# Patient Record
Sex: Male | Born: 1995 | Race: Black or African American | Hispanic: No | Marital: Single | Smoking: Never smoker
Health system: Southern US, Community
[De-identification: ages and names within clinical notes are randomized; demographics above are authoritative.]

## PROBLEM LIST (undated history)

## (undated) DIAGNOSIS — J302 Other seasonal allergic rhinitis: Secondary | ICD-10-CM

---
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
## 2017-03-27 IMAGING — DX DG KNEE COMPLETE 4+V*L*
4 series · 4 of 4 positions shown · non-contrast
Comparison: None.

CLINICAL DATA: Medial pain. Knot in the left knee on [REDACTED].
Football game on [REDACTED] night. Remembers in injury. No history of
injury or surgeries previously.

EXAM:
LEFT KNEE - COMPLETE 4+ VIEW

[knee ap]
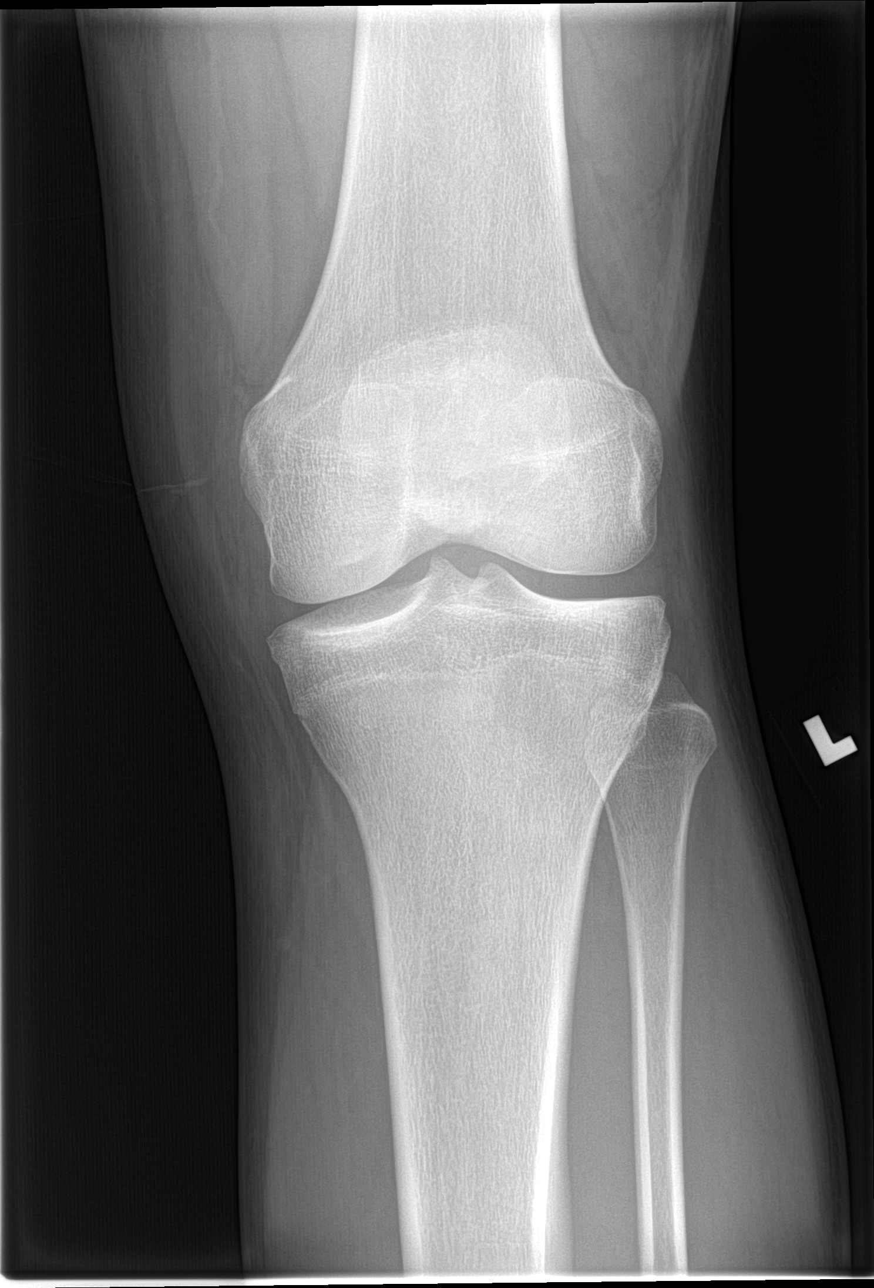

[tunnel]
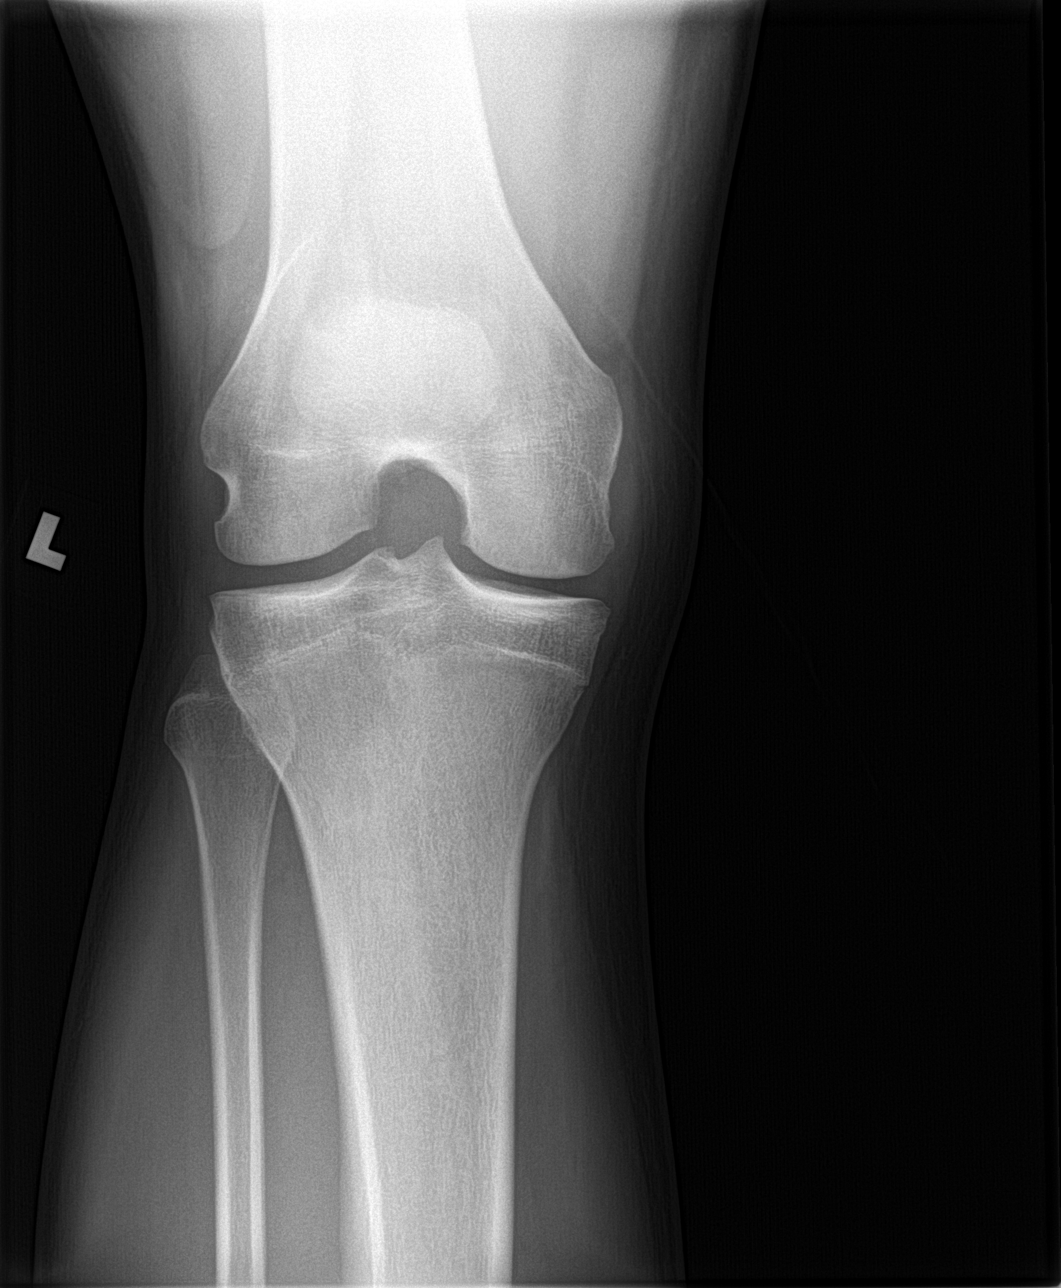

[knee lat]
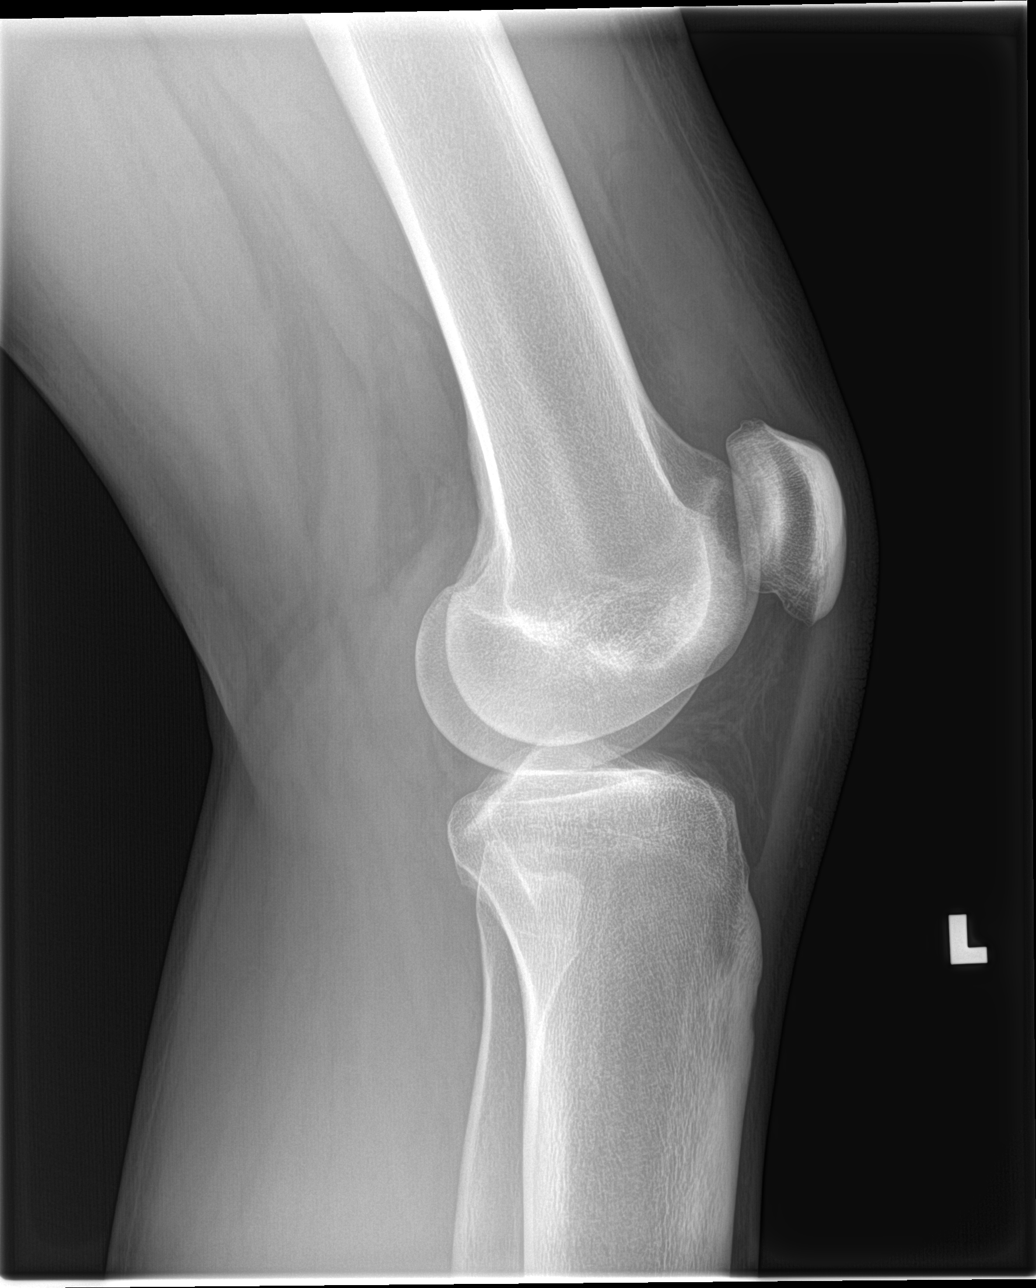

[knee sunrise]
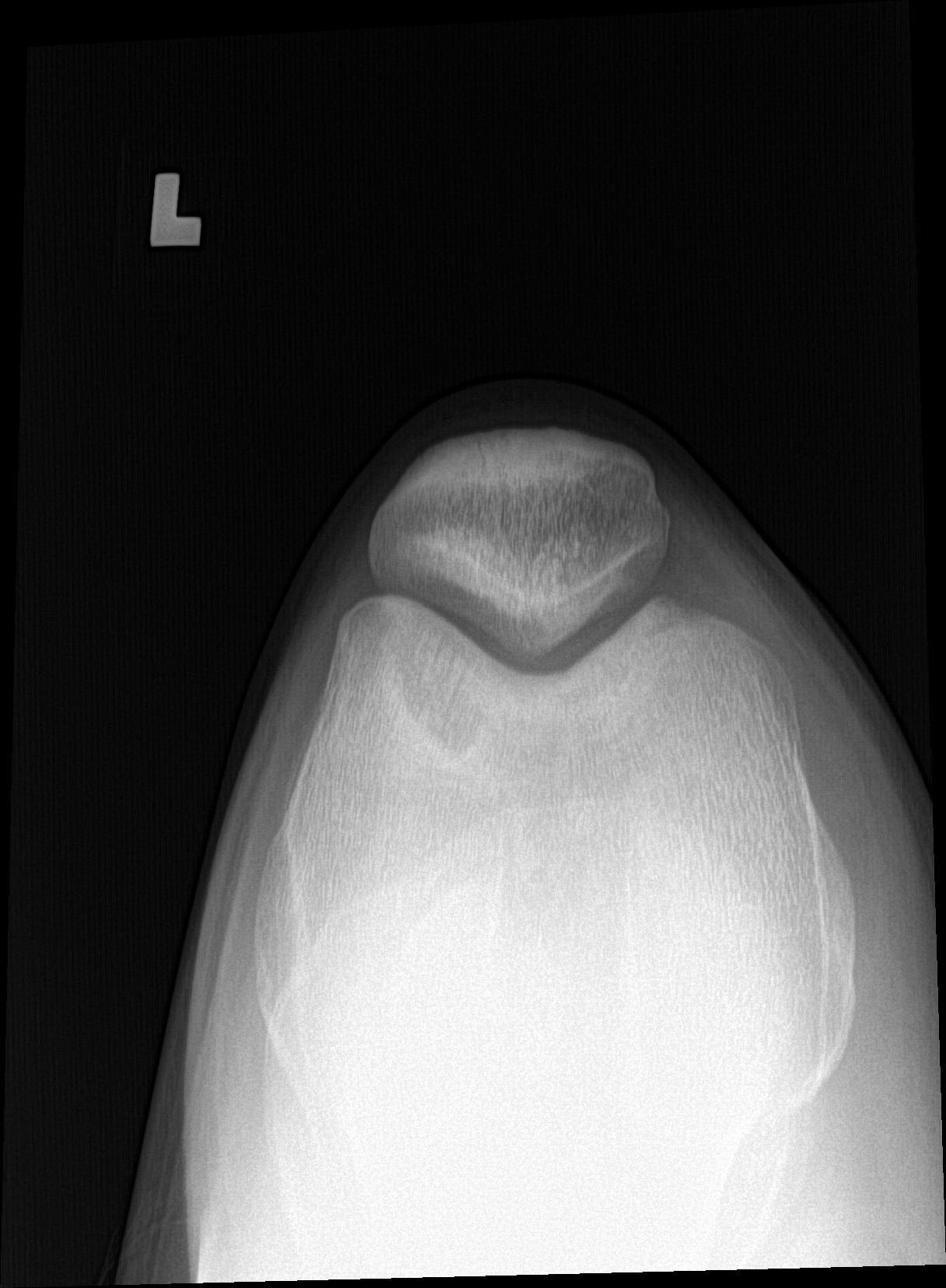

[4 of 4 positions shown; findings below may reference images not displayed]

FINDINGS: Joint effusion is present. There is no acute fracture or
subluxation.
IMPRESSION: Joint effusion.

## 2019-07-20 ENCOUNTER — Other Ambulatory Visit: Payer: Self-pay

## 2019-07-20 ENCOUNTER — Encounter (HOSPITAL_BASED_OUTPATIENT_CLINIC_OR_DEPARTMENT_OTHER): Payer: Self-pay | Admitting: Emergency Medicine

## 2019-07-20 ENCOUNTER — Emergency Department (HOSPITAL_BASED_OUTPATIENT_CLINIC_OR_DEPARTMENT_OTHER)
Admission: EM | Admit: 2019-07-20 | Discharge: 2019-07-20 | Disposition: A | Discharge status: Home / Self Care | Payer: Self-pay | Attending: Emergency Medicine | Admitting: Emergency Medicine

## 2019-07-20 DIAGNOSIS — R030 Elevated blood-pressure reading, without diagnosis of hypertension: Secondary | ICD-10-CM | POA: Insufficient documentation

## 2019-07-20 DIAGNOSIS — R059 Cough, unspecified: Secondary | ICD-10-CM

## 2019-07-20 DIAGNOSIS — R6883 Chills (without fever): Secondary | ICD-10-CM | POA: Insufficient documentation

## 2019-07-20 DIAGNOSIS — R05 Cough: Secondary | ICD-10-CM | POA: Insufficient documentation

## 2019-07-20 DIAGNOSIS — R0981 Nasal congestion: Secondary | ICD-10-CM | POA: Insufficient documentation

## 2019-07-20 MED ORDER — MUCINEX DM MAXIMUM STRENGTH 60-1200 MG PO TB12: ORAL_TABLET | ORAL | 0 refills | Status: AC

## 2019-07-20 MED ORDER — CETIRIZINE-PSEUDOEPHEDRINE ER 5-120 MG PO TB12: ORAL_TABLET | ORAL | 0 refills | Status: AC

## 2019-07-20 MED ORDER — OXYMETAZOLINE HCL 0.05 % NA SOLN
2.0000 | Freq: Two times a day (BID) | NASAL | Status: DC | PRN
  Administered 2019-07-20: 01:00:00 2 via NASAL
  Filled 2019-07-20: qty 30

## 2019-07-20 NOTE — ED Triage Notes (Signed)
Reports head and chest congestion for two days with cough and chills.

## 2019-07-20 NOTE — ED Provider Notes (Signed)
[facility] Provider Note: Georgena Spurling, MD, FACEP  CSN: [id number] MRN: [id number] ARRIVAL: 07/20/19 at [place]: [place]  Nasal Congestion   HISTORY OF PRESENT ILLNESS  07/20/19 12:53 AM Timothy Cochran is a 24 y.o. male with a 2-day history of nasal congestion, cough and chills.  He is also complaining of sinus pressure.  He denies fever, shortness of breath, nausea, vomiting or diarrhea.  Symptoms are moderate.  He has not taken anything for this.   Past Medical History:  Diagnosis Date  . Seasonal allergies     Past Surgical History:  Procedure Laterality Date  . KNEE ARTHROSCOPY Left 02/23/2015   Procedure: LEFT KNEE ARTHROSCOPY, LOOSE BODY REMOVAL  ;  Surgeon: Frederik Pear, MD;  Location: [facility];  Service: Orthopedics;  Laterality: Left;  . KNEE ARTHROSCOPY WITH DRILLING/MICROFRACTURE Left 02/23/2015   Procedure: KNEE ARTHROSCOPY WITH DRILLING/MICROFRACTURE;  Surgeon: Frederik Pear, MD;  Location: [facility];  Service: Orthopedics;  Laterality: Left;    Family History  Problem Relation Age of Onset  . Sudden death Neg Hx   . Heart attack Neg Hx     Social History   Tobacco Use  . Smoking status: Never Smoker  . Smokeless tobacco: Never Used  Substance Use Topics  . Alcohol use: No    Alcohol/week: 0.0 standard drinks  . Drug use: No    Prior to Admission medications   Medication Sig Start Date End Date Taking? Authorizing Provider  cetirizine-pseudoephedrine (ZYRTEC-D) 5-120 MG tablet Take 1 tablet twice daily as needed for seasonal allergies. 07/20/19   Kaelyn Nauta, MD  Dextromethorphan-guaiFENesin (MUCINEX DM MAXIMUM STRENGTH) 60-1200 MG TB12 Take 1 tablet twice daily for cough and congestion. 07/20/19   Akyra Bouchie, Jenny Reichmann, MD    Allergies Patient has no known allergies.   REVIEW OF SYSTEMS  Negative except as noted here or in the History of Present Illness.   PHYSICAL EXAMINATION  Initial  Vital Signs Blood pressure (!) 155/95, pulse 97, temperature 98.7 F (37.1 C), temperature source Oral, resp. rate 18, height 6\' 3"  (1.905 m), weight 127 kg, SpO2 100 %.  Examination General: Well-developed, well-nourished male in no acute distress; appearance consistent with age of record HENT: normocephalic; atraumatic; nasal congestion Eyes: pupils equal, round and reactive to light; extraocular muscles intact Neck: supple Heart: regular rate and rhythm Lungs: clear to auscultation bilaterally; frequent cough Abdomen: soft; nondistended; nontender; bowel sounds present Extremities: No deformity; full range of motion Neurologic: Awake, alert and oriented; motor function intact in all extremities and symmetric; no facial droop Skin: Warm and dry Psychiatric: Normal mood and affect   RESULTS  Summary of this visit's results, reviewed and interpreted by myself:   EKG Interpretation  Date/Time:    Ventricular Rate:    PR Interval:    QRS Duration:   QT Interval:    QTC Calculation:   R Axis:     Text Interpretation:        Laboratory Studies: No results found for this or any previous visit (from the past 24 hour(s)). Imaging Studies: No results found.  ED COURSE and MDM  Nursing notes, initial and subsequent vitals signs, including pulse oximetry, reviewed and interpreted by myself.  Vitals:   07/20/19 0032 07/20/19 0033  BP: (!) 155/95   Pulse: 97   Resp: 18   Temp: 98.7 F (37.1 C)   TempSrc: Oral   SpO2: 100%   Weight:  127  kg  Height:  6\' 3"  (1.905 m)   Medications  oxymetazoline (AFRIN) 0.05 % nasal spray 2 spray (has no administration in time range)    Symptoms consistent with a viral illness versus seasonal allergies.  We will treat symptomatically.  He does not currently meet COVID-19 criteria.  PROCEDURES  Procedures   ED DIAGNOSES     ICD-10-CM   1. Nasal congestion  R09.81   2. Cough  R05        Torry Istre, , MD 07/20/19 [phone]

## 2023-08-08 ENCOUNTER — Emergency Department (HOSPITAL_BASED_OUTPATIENT_CLINIC_OR_DEPARTMENT_OTHER)

## 2023-08-08 ENCOUNTER — Other Ambulatory Visit: Payer: Self-pay

## 2023-08-08 ENCOUNTER — Encounter (HOSPITAL_BASED_OUTPATIENT_CLINIC_OR_DEPARTMENT_OTHER): Payer: Self-pay | Admitting: Emergency Medicine

## 2023-08-08 ENCOUNTER — Emergency Department (HOSPITAL_BASED_OUTPATIENT_CLINIC_OR_DEPARTMENT_OTHER)
Admission: EM | Admit: 2023-08-08 | Discharge: 2023-08-08 | Disposition: A | Discharge status: Home / Self Care | Attending: Emergency Medicine | Admitting: Emergency Medicine

## 2023-08-08 DIAGNOSIS — M25561 Pain in right knee: Secondary | ICD-10-CM | POA: Diagnosis present

## 2023-08-08 NOTE — ED Provider Notes (Signed)
 [facility] Provider Note   CSN: [id number] Arrival date & time: 08/08/23  1544     History Chief Complaint  Patient presents with   Knee Pain    Timothy Cochran is a 28 y.o. male.  Patient past history significant for arthroscopy of the left knee presents emergency department today with concerns of right knee pain.  Reports that he noticed a small bulge that was reducible in the right knee after exercising.  Denies any recent injury or trauma as far as he is aware.  States that the small bulge will pop out typically with walking at this point.  Endorses pain is only worse when this bulge pops out but denies much discomfort at rest.  Reports a history of similar symptoms in the left knee which was due to a "floating object" in the left knee that required surgery for repair.   Knee Pain      Home Medications Prior to Admission medications   Medication Sig Start Date End Date Taking? Authorizing Provider  cetirizine -pseudoephedrine  (ZYRTEC -D) 5-120 MG tablet Take 1 tablet twice daily as needed for seasonal allergies. 07/20/19   Molpus, John, MD  Dextromethorphan-guaiFENesin  (MUCINEX  DM MAXIMUM STRENGTH) 60-1200 MG TB12 Take 1 tablet twice daily for cough and congestion. 07/20/19   Molpus, Autry Legions, MD      Allergies    Patient has no known allergies.    Review of Systems   Review of Systems  Musculoskeletal:        Knee pain  All other systems reviewed and are negative.   Physical Exam Updated Vital Signs BP (!) 159/86 (BP Location: Right Arm)   Pulse 63   Temp 97.9 F (36.6 C) (Oral)   Resp 18   SpO2 100%  Physical Exam Vitals and nursing note reviewed.  Constitutional:      General: He is not in acute distress.    Appearance: He is well-developed.  HENT:     Head: Normocephalic and atraumatic.  Eyes:     Conjunctiva/sclera: Conjunctivae normal.  Cardiovascular:     Rate and Rhythm: Normal rate and regular rhythm.      Heart sounds: No murmur heard. Pulmonary:     Effort: Pulmonary effort is normal. No respiratory distress.     Breath sounds: Normal breath sounds.  Abdominal:     Palpations: Abdomen is soft.     Tenderness: There is no abdominal tenderness.  Musculoskeletal:        General: Tenderness present. No swelling, deformity or signs of injury. Normal range of motion.     Cervical back: Neck supple.       Legs:     Comments: A small firm, rubbery and mobile mass was palpated at the medial aspect of the right knee just superior to the joint line.  Patient was able to reduce this object into what appeared to be the patellar recess.  Skin:    General: Skin is warm and dry.     Capillary Refill: Capillary refill takes less than 2 seconds.  Neurological:     Mental Status: He is alert.  Psychiatric:        Mood and Affect: Mood normal.     ED Results / Procedures / Treatments   Labs (all labs ordered are listed, but only abnormal results are displayed) Labs Reviewed - No data to display  EKG None  Radiology No results found.  Procedures Procedures    Medications Ordered in  ED Medications - No data to display  ED Course/ Medical Decision Making/ A&P                                 Medical Decision Making Amount and/or Complexity of Data Reviewed Radiology: ordered.   This patient presents to the ED for concern of knee pain.  Differential diagnosis includes patellar dislocation, avulsion fracture, plica syndrome, knee strain   Imaging Studies ordered:  I ordered imaging studies including x-ray of the right knee I independently visualized and interpreted imaging which showed no obvious abnormality on my personal interval Kate Pak I agree with the radiologist interpretation   Problem List / ED Course:  Patient presents the emergency department today with concerns of right knee pain.  He reports that he was exercising yesterday when he noticed a small bump developed to the  medial aspect of the right knee.  States he has had similar symptoms in the past on his left knee due to a floating bone after a traumatic injury while playing football.  He states he has had no recent impact or injury to this area so is unsure what would have caused this.  Denies any recent falls, twists, sprains, or other mechanisms of injury.  He reports that the area is rather painless until this bulge appears and then the pain presents and will improve after he is able to physically push the bump down. Physical exam is reassuring.  On initial assessment, there is no visible bump present to the right knee.  After patient walks around for about 15 seconds, a small bump does appear to the medial aspect of the right knee.  The bump is firm and rubbery in appearance and texture but is easily movable.  Patient is able to reduce the bump on his own by pushing it towards the infrapatellar recess area.  Unclear exactly what this is.  X-ray imaging ordered for assessment. On my interpretation, x-ray imaging does not show any obvious or acute findings.  No evident trauma to explain his current symptoms.  I advised patient that given stable knee, he is able to follow-up with orthopedics for further assessment as he may require more advanced imaging to characterize a small bump that he is noticing.  Advised patient return precautions such as persistence of this bump or new or worsening symptoms.  Patient otherwise stable for outpatient follow-up and discharged home.  Final Clinical Impression(s) / ED Diagnoses Final diagnoses:  Acute pain of right knee    Rx / DC Orders ED Discharge Orders     None         Concetta Dee, PA-C 08/08/23 Allyne Jabs, MD 08/09/23 [phone]

## 2023-08-08 NOTE — ED Triage Notes (Signed)
 Pt presents with right knee pain after working out.  Pt reports similar injury in the past to the left knee. Painful with walking and feels like a "bone is popping:" out with walking.

## 2023-08-08 NOTE — Discharge Instructions (Addendum)
 You are seen in the emergency department today for concerns of knee pain.  Your x-ray imaging, interpretation did not show any obvious abnormalities that would explain his current symptoms.  I am unsure exactly what is causing that small little bulge that you have seen pop out.  I would strongly recommend following up with an orthopedic surgeon for further assessment.  He had previously been seen by Dr. Carry Clapper with [facility].  You can follow-up with that same office if you would like as you are previously established with them.
